# Patient Record
Sex: Male | Born: 2000 | Race: White | Hispanic: No | Marital: Single | Smoking: Never smoker
Health system: Southern US, Community
[De-identification: ages and names within clinical notes are randomized; demographics above are authoritative.]

## PROBLEM LIST (undated history)

## (undated) DIAGNOSIS — T7840XA Allergy, unspecified, initial encounter: Secondary | ICD-10-CM

## (undated) DIAGNOSIS — M94269 Chondromalacia, unspecified knee: Secondary | ICD-10-CM

## (undated) DIAGNOSIS — Z8614 Personal history of Methicillin resistant Staphylococcus aureus infection: Secondary | ICD-10-CM

## (undated) HISTORY — PX: ADENOIDECTOMY: SUR15

---
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
## 2018-11-05 ENCOUNTER — Other Ambulatory Visit: Payer: Self-pay

## 2018-11-05 DIAGNOSIS — Z20822 Contact with and (suspected) exposure to covid-19: Secondary | ICD-10-CM

## 2018-11-06 LAB — NOVEL CORONAVIRUS, NAA: SARS-CoV-2, NAA: NOT DETECTED

## 2019-07-04 ENCOUNTER — Ambulatory Visit: Payer: Self-pay | Attending: Internal Medicine

## 2019-07-04 DIAGNOSIS — Z23 Encounter for immunization: Secondary | ICD-10-CM

## 2019-07-04 NOTE — Progress Notes (Signed)
   Covid-19 Vaccination Clinic  Name:  Steven [name]    MRN: [id number] DOB: May 12, 2000  07/04/2019  [name] was observed post Covid-19 immunization for 15 minutes without incident. He was provided with Vaccine Information Sheet and instruction to access the V-Safe system.   [name] was instructed to call 911 with any severe reactions post vaccine: Marland Kitchen Difficulty breathing  . Swelling of face and throat  . A fast heartbeat  . A bad rash all over body  . Dizziness and weakness   Immunizations Administered    Name Date Dose VIS Date Route   Pfizer COVID-19 Vaccine 07/04/2019  1:49 PM 0.3 mL 03/08/2019 Intramuscular   Manufacturer: [employer], [employer]   Lot: [id number]   NDC: [address]-2

## 2019-07-29 ENCOUNTER — Ambulatory Visit: Payer: Self-pay | Attending: Internal Medicine

## 2019-07-29 DIAGNOSIS — Z23 Encounter for immunization: Secondary | ICD-10-CM

## 2019-07-29 NOTE — Progress Notes (Signed)
   Covid-19 Vaccination Clinic  Name:  Steven [name]    MRN: [id number] DOB: [birth year]  07/29/2019  [name] was observed post Covid-19 immunization for 15 minutes without incident. He was provided with Vaccine Information Sheet and instruction to access the V-Safe system.   [name] was instructed to call 911 with any severe reactions post vaccine: Marland Kitchen Difficulty breathing  . Swelling of face and throat  . A fast heartbeat  . A bad rash all over body  . Dizziness and weakness   Immunizations Administered    Name Date Dose VIS Date Route   Pfizer COVID-19 Vaccine 07/29/2019  4:48 PM 0.3 mL 05/22/2018 Intramuscular   Manufacturer: [employer], [employer]   Lot: [id number]   NDC: [address]-6
